# Patient Record
Sex: Female | Born: 1955 | Race: Black or African American | Hispanic: No | Marital: Single
Health system: Southern US, Community
[De-identification: ages and names within clinical notes are randomized; demographics above are authoritative.]

---

## 2019-11-06 ENCOUNTER — Emergency Department (HOSPITAL_COMMUNITY)
Admission: EM | Admit: 2019-11-06 | Discharge: 2019-11-07 | Disposition: A | Discharge status: Home / Self Care | Payer: Medicaid Other | Attending: Emergency Medicine | Admitting: Emergency Medicine

## 2019-11-06 ENCOUNTER — Other Ambulatory Visit: Payer: Self-pay

## 2019-11-06 ENCOUNTER — Emergency Department (HOSPITAL_COMMUNITY): Payer: Medicaid Other

## 2019-11-06 ENCOUNTER — Encounter (HOSPITAL_COMMUNITY): Payer: Self-pay

## 2019-11-06 DIAGNOSIS — Y929 Unspecified place or not applicable: Secondary | ICD-10-CM | POA: Diagnosis not present

## 2019-11-06 DIAGNOSIS — S01511A Laceration without foreign body of lip, initial encounter: Secondary | ICD-10-CM

## 2019-11-06 DIAGNOSIS — Z23 Encounter for immunization: Secondary | ICD-10-CM | POA: Diagnosis not present

## 2019-11-06 DIAGNOSIS — Y999 Unspecified external cause status: Secondary | ICD-10-CM | POA: Diagnosis not present

## 2019-11-06 DIAGNOSIS — X58XXXA Exposure to other specified factors, initial encounter: Secondary | ICD-10-CM | POA: Insufficient documentation

## 2019-11-06 DIAGNOSIS — I609 Nontraumatic subarachnoid hemorrhage, unspecified: Secondary | ICD-10-CM | POA: Diagnosis not present

## 2019-11-06 DIAGNOSIS — R55 Syncope and collapse: Secondary | ICD-10-CM

## 2019-11-06 DIAGNOSIS — S00501A Unspecified superficial injury of lip, initial encounter: Secondary | ICD-10-CM | POA: Diagnosis present

## 2019-11-06 DIAGNOSIS — Y939 Activity, unspecified: Secondary | ICD-10-CM | POA: Diagnosis not present

## 2019-11-06 LAB — COMPREHENSIVE METABOLIC PANEL
ALT: 23 U/L (ref 0–44)
AST: 27 U/L (ref 15–41)
Albumin: 3.6 g/dL (ref 3.5–5.0)
Alkaline Phosphatase: 51 U/L (ref 38–126)
Anion gap: 12 (ref 5–15)
BUN: 17 mg/dL (ref 8–23)
CO2: 22 mmol/L (ref 22–32)
Calcium: 9.6 mg/dL (ref 8.9–10.3)
Chloride: 107 mmol/L (ref 98–111)
Creatinine, Ser: 1.27 mg/dL — ABNORMAL HIGH (ref 0.44–1.00)
GFR calc Af Amer: 52 mL/min — ABNORMAL LOW (ref >60)
GFR calc non Af Amer: 45 mL/min — ABNORMAL LOW (ref >60)
Glucose, Bld: 126 mg/dL — ABNORMAL HIGH (ref 70–99)
Potassium: 3.9 mmol/L (ref 3.5–5.1)
Sodium: 141 mmol/L (ref 135–145)
Total Bilirubin: 0.7 mg/dL (ref 0.3–1.2)
Total Protein: 7.4 g/dL (ref 6.5–8.1)

## 2019-11-06 LAB — CBC WITH DIFFERENTIAL/PLATELET
Abs Immature Granulocytes: 0.13 10*3/uL — ABNORMAL HIGH (ref 0.00–0.07)
Basophils Absolute: 0 10*3/uL (ref 0.0–0.1)
Basophils Relative: 0 %
Eosinophils Absolute: 0 10*3/uL (ref 0.0–0.5)
Eosinophils Relative: 0 %
HCT: 44.6 % (ref 36.0–46.0)
Hemoglobin: 14.8 g/dL (ref 12.0–15.0)
Immature Granulocytes: 1 %
Lymphocytes Relative: 6 %
Lymphs Abs: 1 10*3/uL (ref 0.7–4.0)
MCH: 31 pg (ref 26.0–34.0)
MCHC: 33.2 g/dL (ref 30.0–36.0)
MCV: 93.3 fL (ref 80.0–100.0)
Monocytes Absolute: 1 10*3/uL (ref 0.1–1.0)
Monocytes Relative: 6 %
Neutro Abs: 15 10*3/uL — ABNORMAL HIGH (ref 1.7–7.7)
Neutrophils Relative %: 87 %
Platelets: 273 10*3/uL (ref 150–400)
RBC: 4.78 MIL/uL (ref 3.87–5.11)
RDW: 14.5 % (ref 11.5–15.5)
WBC: 17.2 10*3/uL — ABNORMAL HIGH (ref 4.0–10.5)
nRBC: 0 % (ref 0.0–0.2)

## 2019-11-06 LAB — CBG MONITORING, ED: Glucose-Capillary: 133 mg/dL — ABNORMAL HIGH (ref 70–99)

## 2019-11-06 LAB — TROPONIN I (HIGH SENSITIVITY): Troponin I (High Sensitivity): 5 ng/L (ref <18)

## 2019-11-06 LAB — ETHANOL: Alcohol, Ethyl (B): <10 mg/dL (ref <10)

## 2019-11-06 MED ORDER — TETANUS-DIPHTH-ACELL PERTUSSIS 5-2.5-18.5 LF-MCG/0.5 IM SUSP
0.5000 mL | Freq: Once | INTRAMUSCULAR | Status: AC
  Administered 2019-11-06: 0.5 mL via INTRAMUSCULAR
  Filled 2019-11-06: qty 0.5

## 2019-11-06 MED ORDER — LIDOCAINE HCL (PF) 1 % IJ SOLN
5.0000 mL | Freq: Once | INTRAMUSCULAR | Status: AC
  Administered 2019-11-07: 5 mL via INTRADERMAL
  Filled 2019-11-06: qty 5

## 2019-11-06 NOTE — ED Notes (Signed)
Patient transported to X-ray 

## 2019-11-06 NOTE — ED Provider Notes (Signed)
[facility]   CSN: [id number] Arrival date & time: 11/06/19  2052     History Chief Complaint  Patient presents with  . Fall  . Loss of Consciousness    Jillian GivenSharon L Franco is a 64 y.o. female.  The history is provided by the patient and medical records.  Fall   64 year old female presenting to the ED after syncopal episode.  She is originally from: [place] and is currently in [place].  States they were outside majority of the day today and she got overheated and passed out.  Family at bedside reports she was "out of it" for about 3 minutes.  States family member thought they could not feel a pulse and initiated CPR, to which patient immediately became responsive and yelled out.  Currently, states she feels fine, she is just tired and very thirsty.  States otherwise recently she has been doing very well.  She has not had any recent dizziness, chest pain, or shortness of breath.  She did sustain small laceration to lower lip during the syncopal event.  She is not on anticoagulation.  Last tetanus unknown.  History reviewed. No pertinent past medical history.  There are no problems to display for this patient.   History reviewed. No pertinent surgical history.   OB History   No obstetric history on file.     History reviewed. No pertinent family history.  Social History   Tobacco Use  . Smoking status: Not on file  Substance Use Topics  . Alcohol use: Not on file  . Drug use: Not on file    Home Medications Prior to Admission medications   Not on File    Allergies    Patient has no allergy information on record.  Review of Systems   Review of Systems  Neurological: Positive for syncope.  All other systems reviewed and are negative.   Physical Exam Updated Vital Signs BP 128/71 (BP Location: Right Arm)   Pulse 98   Temp 98.4 F (36.9 C) (Oral)   Resp 20   Ht 5\' 5"  (1.651 m)    Wt 79.4 kg   SpO2 96%   BMI 29.12 kg/m   Physical Exam Vitals and nursing note reviewed.  Constitutional:      Appearance: She is well-developed.  HENT:     Head: Normocephalic and atraumatic.     Mouth/Throat:      Comments: 2cm laceration to skin of lower lip, this is not through and through, no active bleeding, dentition appears intact Eyes:     Conjunctiva/sclera: Conjunctivae normal.     Pupils: Pupils are equal, round, and reactive to light.     Comments: PERRL  Neck:     Comments: c-collar in place, ROM not tested Cardiovascular:     Rate and Rhythm: Normal rate and regular rhythm.     Heart sounds: Normal heart sounds.  Pulmonary:     Effort: Pulmonary effort is normal. No respiratory distress.     Breath sounds: Normal breath sounds. No rhonchi.  Abdominal:     General: Bowel sounds are normal.     Palpations: Abdomen is soft.     Tenderness: There is no abdominal tenderness. There is no rebound.  Musculoskeletal:        General: Normal range of motion.  Skin:    General: Skin is warm and dry.  Neurological:     Mental Status: She is alert and  oriented to person, place, and time.     Comments: AAOx3, answering questions and following commands appropriately; equal strength UE and LE bilaterally; CN grossly intact; moves all extremities appropriately without ataxia; no focal neuro deficits or facial asymmetry appreciated     ED Results / Procedures / Treatments   Labs (all labs ordered are listed, but only abnormal results are displayed) Labs Reviewed  COMPREHENSIVE METABOLIC PANEL - Abnormal; Notable for the following components:      Result Value   Glucose, Bld 126 (*)    Creatinine, Ser 1.27 (*)    GFR calc non Af Amer 45 (*)    GFR calc Af Amer 52 (*)    All other components within normal limits  CBC WITH DIFFERENTIAL/PLATELET - Abnormal; Notable for the following components:   WBC 17.2 (*)    Neutro Abs 15.0 (*)    Abs Immature Granulocytes 0.13 (*)      All other components within normal limits  CBG MONITORING, ED - Abnormal; Notable for the following components:   Glucose-Capillary 133 (*)    All other components within normal limits  ETHANOL  URINALYSIS, ROUTINE W REFLEX MICROSCOPIC  TROPONIN I (HIGH SENSITIVITY)  TROPONIN I (HIGH SENSITIVITY)    EKG EKG Interpretation  Date/Time:  Wednesday November 06 2019 21:00:57 EDT Ventricular Rate:  106 PR Interval:    QRS Duration: 94 QT Interval:  266 QTC Calculation: 354 R Axis:   56 Text Interpretation: Sinus tachycardia Probable anteroseptal infarct, old No old tracing to compare Confirmed by Susy Frizzle [phone]) on 11/06/2019 9:02:47 PM   Radiology DG Chest 2 View  Result Date: 11/06/2019 CLINICAL DATA:  Syncope EXAM: CHEST - 2 VIEW COMPARISON:  None. FINDINGS: Multiple metallic artifacts over the neck. No consolidation or effusion. Normal cardiomediastinal silhouette. No pneumothorax. IMPRESSION: No active cardiopulmonary disease. Electronically Signed   By: Jasmine Pang M.D.   On: 11/06/2019 21:55   CT Head Wo Contrast  Result Date: 11/07/2019 CLINICAL DATA:  Subarachnoid hemorrhage follow-up EXAM: CT HEAD WITHOUT CONTRAST TECHNIQUE: Contiguous axial images were obtained from the base of the skull through the vertex without intravenous contrast. COMPARISON:  Yesterday FINDINGS: Brain: Small volume subarachnoid hemorrhage in the left sylvian fissure, stable to regressed. No swelling, hydrocephalus, or shift. Negative for infarct Vascular: No hyperdense vessel or unexpected calcification. Skull: Normal. Negative for fracture or focal lesion. Sinuses/Orbits: No traumatic finding. Small left maxillary sinus fluid level which is low-density. IMPRESSION: No progression of the trace subarachnoid hemorrhage at the left sylvian fissure. Electronically Signed   By: Marnee Spring M.D.   On: 11/07/2019 05:29   CT Head Wo Contrast  Result Date: 11/06/2019 CLINICAL DATA:  Polytrauma,  critical, head/C-spine injury suspected Syncope. EXAM: CT HEAD WITHOUT CONTRAST CT CERVICAL SPINE WITHOUT CONTRAST TECHNIQUE: Multidetector CT imaging of the head and cervical spine was performed following the standard protocol without intravenous contrast. Multiplanar CT image reconstructions of the cervical spine were also generated. COMPARISON:  None. FINDINGS: CT HEAD FINDINGS Brain: Small volume subarachnoid hemorrhage in the left sylvian fissure. No surrounding mass effect. There is no midline shift. No intraparenchymal or subdural bleed. Brain volume is normal for age. No hydrocephalus. No evidence of acute ischemia or infarct. Vascular: No hyperdense vessel or unexpected calcification. Skull: No fracture or focal lesion. Sinuses/Orbits: Fluid level in left maxillary sinus with mucosal thickening. Remaining paranasal sinuses are clear. No evidence of acute fracture. Orbits are unremarkable. Other: None. CT CERVICAL SPINE FINDINGS Alignment: Normal. Skull  base and vertebrae: No acute fracture. Dens and skull base are intact. Mild sclerosis of C5 on C6 likely degenerative. There is also a bone island within left lamina/spinous process of C7. Soft tissues and spinal canal: No prevertebral fluid or swelling. No visible canal hematoma. Disc levels: Disc space narrowing and endplate spurring most prominent at C5-C6, also C4-C5 and C6-C7. There is minor scattered facet hypertrophy. Upper chest: Emphysema.  No acute findings. Other: There is elongation of the left styloid process. IMPRESSION: 1. Small volume subarachnoid hemorrhage in the left Sylvian fissure. No surrounding mass effect. No skull fracture. 2. Degenerative change in the cervical spine without acute fracture or subluxation. Emphysema (ICD10-J43.9). Critical Value/emergent results were called by telephone at the time of interpretation on 11/06/2019 at 10:28 pm to Dr Susy Frizzle , who verbally acknowledged these results. Electronically Signed   By:  Narda Rutherford M.D.   On: 11/06/2019 22:42   CT Cervical Spine Wo Contrast  Result Date: 11/06/2019 CLINICAL DATA:  Polytrauma, critical, head/C-spine injury suspected Syncope. EXAM: CT HEAD WITHOUT CONTRAST CT CERVICAL SPINE WITHOUT CONTRAST TECHNIQUE: Multidetector CT imaging of the head and cervical spine was performed following the standard protocol without intravenous contrast. Multiplanar CT image reconstructions of the cervical spine were also generated. COMPARISON:  None. FINDINGS: CT HEAD FINDINGS Brain: Small volume subarachnoid hemorrhage in the left sylvian fissure. No surrounding mass effect. There is no midline shift. No intraparenchymal or subdural bleed. Brain volume is normal for age. No hydrocephalus. No evidence of acute ischemia or infarct. Vascular: No hyperdense vessel or unexpected calcification. Skull: No fracture or focal lesion. Sinuses/Orbits: Fluid level in left maxillary sinus with mucosal thickening. Remaining paranasal sinuses are clear. No evidence of acute fracture. Orbits are unremarkable. Other: None. CT CERVICAL SPINE FINDINGS Alignment: Normal. Skull base and vertebrae: No acute fracture. Dens and skull base are intact. Mild sclerosis of C5 on C6 likely degenerative. There is also a bone island within left lamina/spinous process of C7. Soft tissues and spinal canal: No prevertebral fluid or swelling. No visible canal hematoma. Disc levels: Disc space narrowing and endplate spurring most prominent at C5-C6, also C4-C5 and C6-C7. There is minor scattered facet hypertrophy. Upper chest: Emphysema.  No acute findings. Other: There is elongation of the left styloid process. IMPRESSION: 1. Small volume subarachnoid hemorrhage in the left Sylvian fissure. No surrounding mass effect. No skull fracture. 2. Degenerative change in the cervical spine without acute fracture or subluxation. Emphysema (ICD10-J43.9). Critical Value/emergent results were called by telephone at the time of  interpretation on 11/06/2019 at 10:28 pm to Dr Susy Frizzle , who verbally acknowledged these results. Electronically Signed   By: Narda Rutherford M.D.   On: 11/06/2019 22:42    Procedures Procedures (including critical care time)  CRITICAL CARE Performed by: Garlon Hatchet   Total critical care time: 45 minutes  Critical care time was exclusive of separately billable procedures and treating other patients.  Critical care was necessary to treat or prevent imminent or life-threatening deterioration.  Critical care was time spent personally by me on the following activities: development of treatment plan with patient and/or surrogate as well as nursing, discussions with consultants, evaluation of patient's response to treatment, examination of patient, obtaining history from patient or surrogate, ordering and performing treatments and interventions, ordering and review of laboratory studies, ordering and review of radiographic studies, pulse oximetry and re-evaluation of patient's condition.  LACERATION REPAIR Performed by: Garlon Hatchet Authorized by: Garlon Hatchet  Consent: Verbal consent obtained. Risks and benefits: risks, benefits and alternatives were discussed Consent Franco by: patient Patient identity confirmed: provided demographic data Prepped and Draped in normal sterile fashion Wound explored  Laceration Location: lower lip  Laceration Length: 2cm  No Foreign Bodies seen or palpated  Anesthesia: local infiltration  Local anesthetic: lidocaine 1% without epinephrine  Anesthetic total: 4 ml  Irrigation method: syringe Amount of cleaning: standard  Skin closure: 5-0 prolene  Number of sutures: 5  Technique: simple interrupted  Patient tolerance: Patient tolerated the procedure well with no immediate complications.    Medications Ordered in ED Medications  acetaminophen (TYLENOL) tablet 1,000 mg (has no administration in time range)  lidocaine (PF)  (XYLOCAINE) 1 % injection 5 mL (5 mLs Intradermal Franco 11/07/19 0032)  Tdap (BOOSTRIX) injection 0.5 mL (0.5 mLs Intramuscular Franco 11/06/19 2328)    ED Course  I have reviewed the triage vital signs and the nursing notes.  Pertinent labs & imaging results that were available during my care of the patient were reviewed by me and considered in my medical decision making (see chart for details).    MDM Rules/Calculators/A&P  64 year old female presenting to the ED after a syncopal event. She is in [place] from [place] for a family funeral. They were outside for several hours today and she got overheated as she has not used to the humidity here. She was unresponsive for about 3 minutes per family but has been awake and alert since then. She did sustain small laceration to her lower lip but this is not full-thickness. Dentition appears intact. She denies any recent chest pain or shortness of breath. No recent illness. No history of syncope in the past. Labs along with CT of the head and neck and chest x-ray are pending. Will update tetanus.  Head CT with small volume subarachnoid hemorrhage in the left sylvian fissure. No mass-effect or skull fracture. Patient remains awake and alert. She is not currently on anticoagulation. Will discuss with neurosurgery for recommendations.  11:28 PM Spoke with neurosurgery, Dr. Maisie Fus-- observe, repeat head CT in 6 hours.  If no acute change or worsening, can likely discharge home.  Laceration repaired, patient tolerated well.  Family has been updated on findings and need for observation in ED and repeat head CT at 6 hour interval.  If stable, they will be able to be discharged.  Patient and family comfortable with this plan as they have family funeral tomorrow. Remainder of her work-up is reassuring at this time.  c-collar has been removed, able to range neck without difficulty.  Patient Franco blankets and is resting comfortably at this time.  Family remains  at bedside.  Will monitor.  5:37 AM Repeat head CT without any interval progression of small subarachnoid. Patient remains awake and alert, hemodynamically stable. Discussed results with patient and family at bedside, they acknowledged understanding. Feel she is stable for discharge home. I discussed home wound care instructions regarding sutures in lower lip, will require removal in 1 week once they return home to [place]. We have also discussed concussion care and signs or symptoms that would warrant more emergent ED return including confusion, dizziness, profuse vomiting, etc. Otherwise they will follow-up with PCP once returning home.  Final Clinical Impression(s) / ED Diagnoses Final diagnoses:  Syncope and collapse  Subarachnoid bleed (HCC)  Lip laceration, initial encounter    Rx / DC Orders ED Discharge Orders         Ordered  ondansetron (ZOFRAN ODT) 4 MG disintegrating tablet  Every 8 hours PRN        11/07/19 0541    acetaminophen (TYLENOL) 500 MG tablet  Every 6 hours PRN        11/07/19 0541           Garlon Hatchet, PA-C 11/07/19 0546    Pollyann Savoy, MD 11/07/19 1520
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
## 2019-11-07 ENCOUNTER — Emergency Department (HOSPITAL_COMMUNITY): Payer: Medicaid Other

## 2019-11-07 LAB — TROPONIN I (HIGH SENSITIVITY): Troponin I (High Sensitivity): 4 ng/L (ref <18)

## 2019-11-07 MED ORDER — ONDANSETRON 4 MG PO TBDP: 4.0000 mg | ORAL_TABLET | Freq: Three times a day (TID) | ORAL | 0 refills | Status: AC | PRN

## 2019-11-07 MED ORDER — ACETAMINOPHEN 500 MG PO TABS
1000.0000 mg | ORAL_TABLET | Freq: Once | ORAL | Status: AC
  Administered 2019-11-07: 1000 mg via ORAL
  Filled 2019-11-07: qty 2

## 2019-11-07 MED ORDER — ACETAMINOPHEN 500 MG PO TABS: 500.0000 mg | ORAL_TABLET | Freq: Four times a day (QID) | ORAL | 0 refills | Status: AC | PRN

## 2019-11-07 NOTE — Discharge Instructions (Addendum)
Sutures will need to be removed in about 1 week-- your primary care doctor can do this once you return home. Area of blood on brain is very small.  Can continue to have a headache which is common, recommend tylenol for this.  Avoid aspirin as this can worsen bleeding. Should you begin to experience any dizziness, confusion, numbness, weakness, slurred speech, difficulty walking, or profuse vomiting you need to return to the ER immediately.

## 2019-11-07 NOTE — ED Notes (Signed)
Per discharge pt offered wheelchair, able to transfer self from bed to wheelchair without assistance, daughter at bedside. Pt transported home by family in stable condition.

## 2022-04-19 IMAGING — CT CT CERVICAL SPINE W/O CM
5 of 8 series · 13 of 33 positions shown, 14 images · non-contrast
Comparison: None.

CLINICAL DATA: Polytrauma, critical, head/C-spine injury suspected

Syncope.
EXAM:
CT HEAD WITHOUT CONTRAST
CT CERVICAL SPINE WITHOUT CONTRAST
TECHNIQUE: Multidetector CT imaging of the head and cervical spine was
performed following the standard protocol without intravenous
contrast. Multiplanar CT image reconstructions of the cervical spine
were also generated.

[Series 4: head bone · axial · 0.42mm/px · z∈[-140,-86]mm · 2 of 81 slices shown]
[im 27/81  bone]
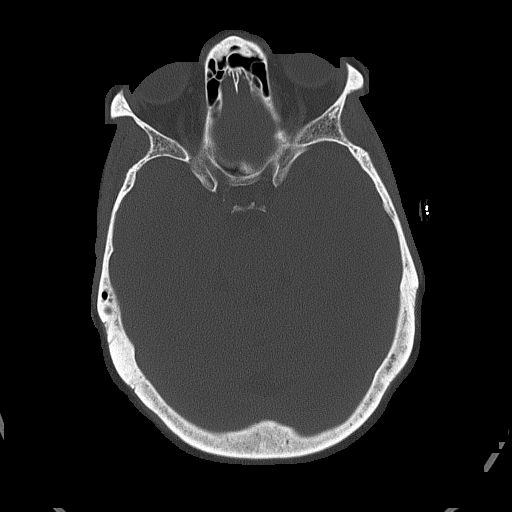
[im 54/81  bone]
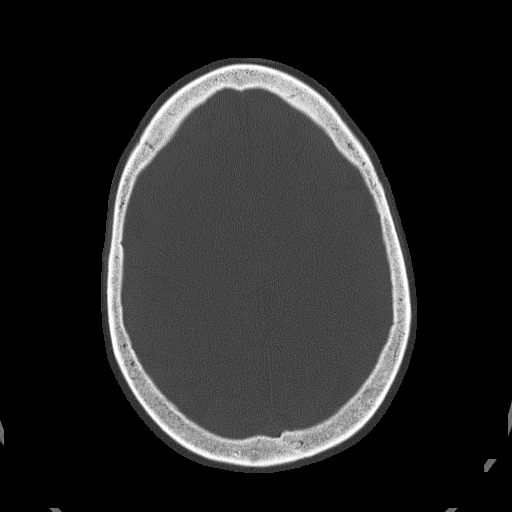

[Series 7: c_spine 2.0 st · axial · 0.31mm/px · z∈[-302,-206]mm · 3 of 97 slices shown, 4 images]
[im 25/97  soft-tissue]
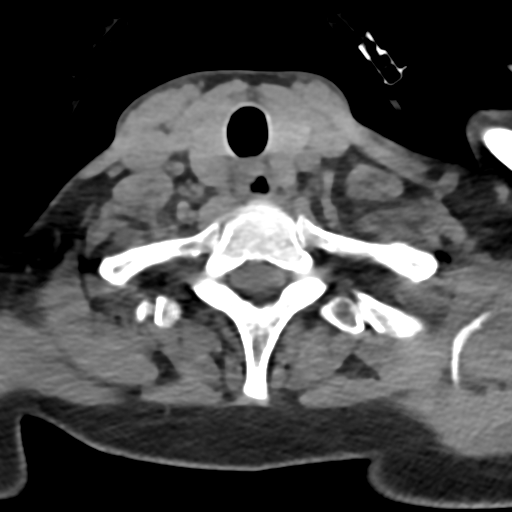
[im 25/97  bone]
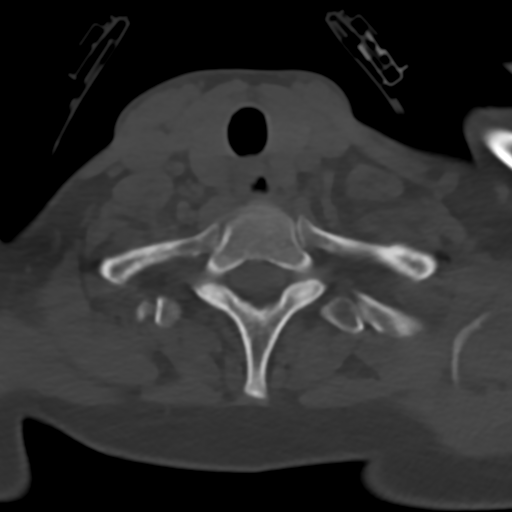
[im 49/97  bone]
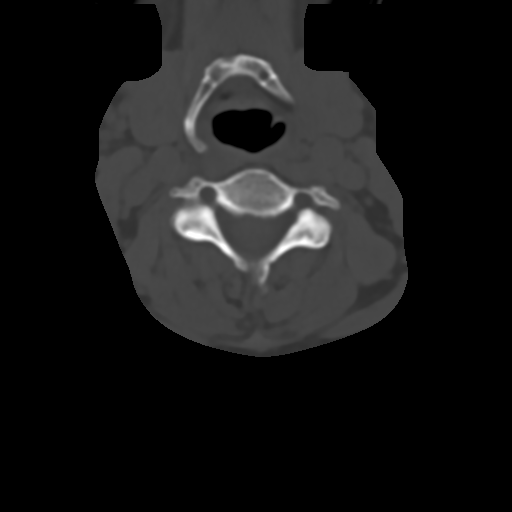
[im 73/97  bone]
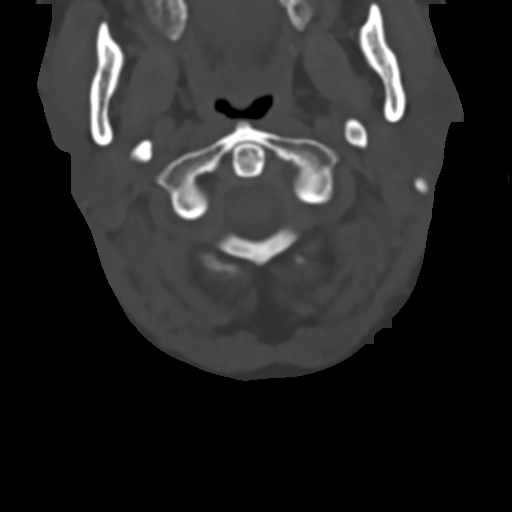

[Series 9: c_spine 2.0 sag bone · sagittal · 0.33mm/px · 5 of 84 slices shown]
[im 14/84  bone]
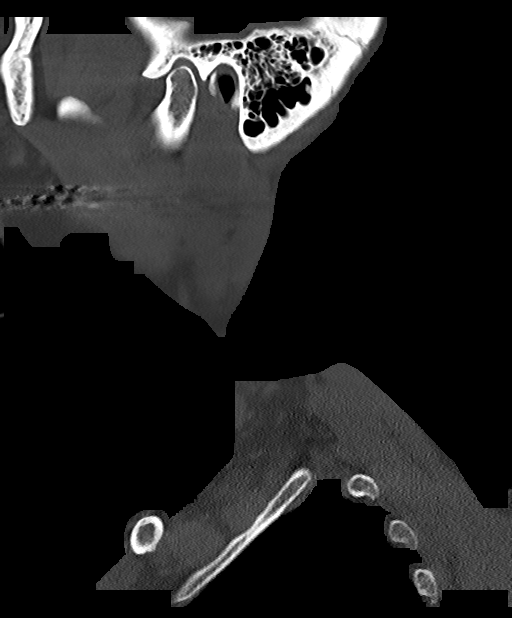
[im 28/84  bone]
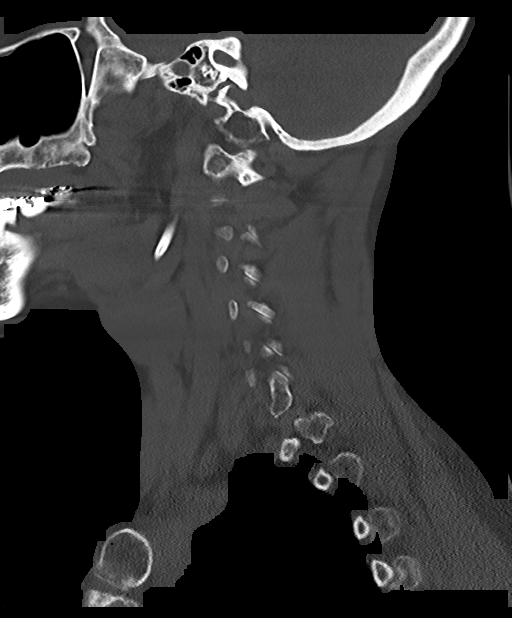
[im 42/84  bone]
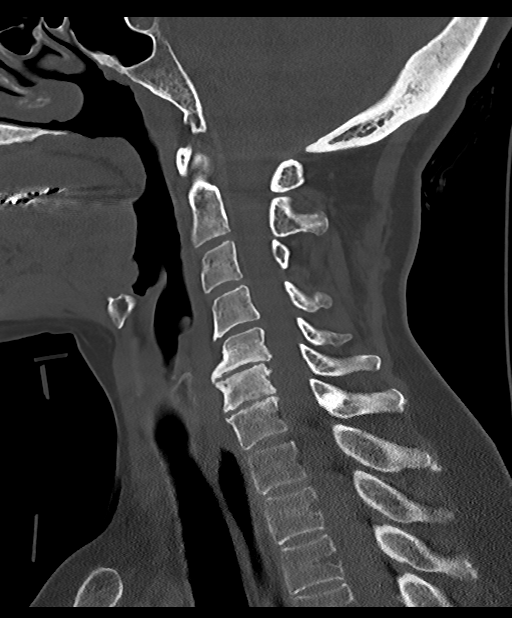
[im 56/84  bone]
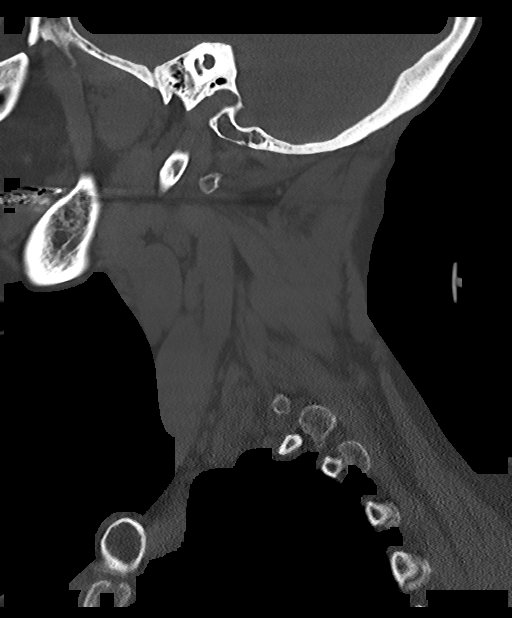
[im 70/84  bone]
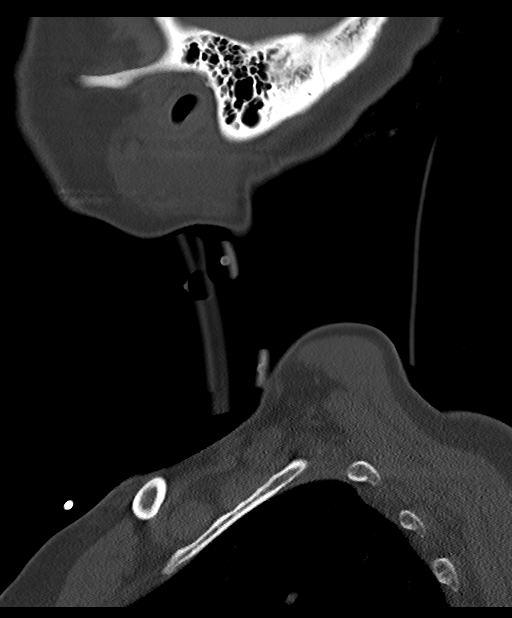

[Series 11: c_spine 2.0 cor bone · coronal · 0.31mm/px · 1 of 81 slices shown]
[im 41/81  bone]
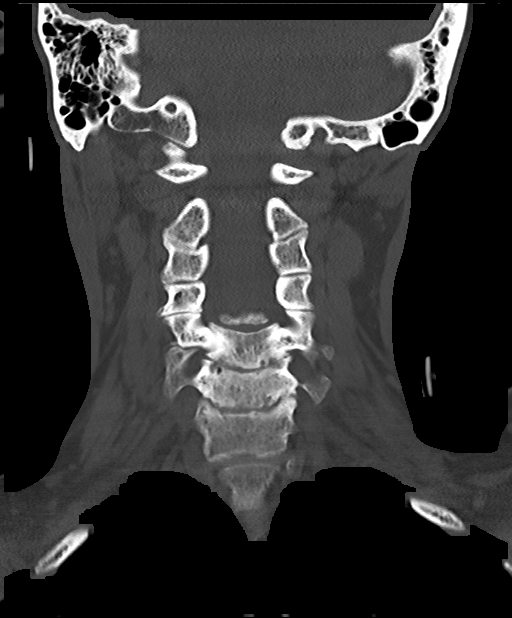

[Series 14: c_spine 2.0 orthogonals · axial · 0.27mm/px · z∈[-312,-258]mm · 2 of 90 slices shown]
[im 30/90  bone]
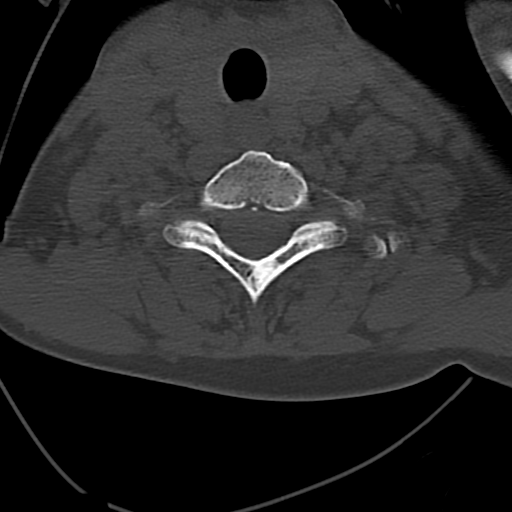
[im 60/90  bone]
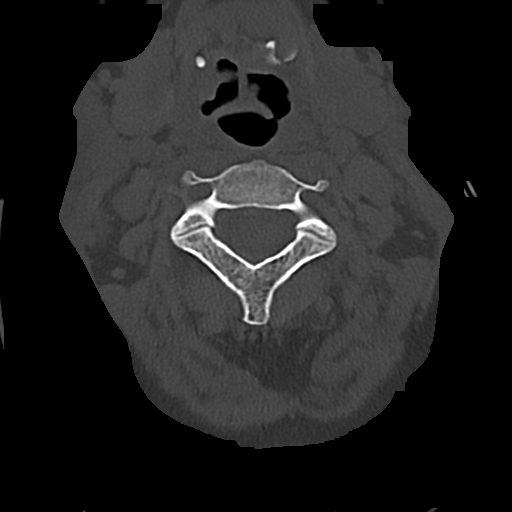

[13 of 33 positions shown; findings below may reference images not displayed]

FINDINGS: CT HEAD FINDINGS

Brain: Small volume subarachnoid hemorrhage in the left sylvian
fissure. No surrounding mass effect. There is no midline shift. No
intraparenchymal or subdural bleed. Brain volume is normal for age.
No hydrocephalus. No evidence of acute ischemia or infarct.

Vascular: No hyperdense vessel or unexpected calcification.

Skull: No fracture or focal lesion.

Sinuses/Orbits: Fluid level in left maxillary sinus with mucosal
thickening. Remaining paranasal sinuses are clear. No evidence of
acute fracture. Orbits are unremarkable.

Other: None.

CT CERVICAL SPINE FINDINGS

Alignment: Normal.

Skull base and vertebrae: No acute fracture. Dens and skull base are
intact. Mild sclerosis of C5 on C6 likely degenerative. There is
also a bone island within left lamina/spinous process of C7.

Soft tissues and spinal canal: No prevertebral fluid or swelling. No
visible canal hematoma.

Disc levels: Disc space narrowing and endplate spurring most
prominent at C5-C6, also C4-C5 and C6-C7. There is minor scattered
facet hypertrophy.

Upper chest: Emphysema.  No acute findings.

Other: There is elongation of the left styloid process.
IMPRESSION: 1. Small volume subarachnoid hemorrhage in the left Sylvian fissure.
No surrounding mass effect. No skull fracture.
2. Degenerative change in the cervical spine without acute fracture
or subluxation.

Emphysema ([id number]).

Critical Value/emergent results were called by telephone at the time
of interpretation on 11/06/2019 at [DATE] to Dr QUIRIJN AMAZIGH ,
who verbally acknowledged these results.
